# Patient Record
Sex: Male | Born: 1960 | Race: Black or African American | Hispanic: No | Marital: Single | Smoking: Never smoker
Health system: Southern US, Community
[De-identification: ages and names within clinical notes are randomized; demographics above are authoritative.]

## PROBLEM LIST (undated history)

## (undated) HISTORY — PX: TONSILLECTOMY: SUR1361

---

## 2004-02-14 ENCOUNTER — Emergency Department (HOSPITAL_COMMUNITY): Admission: EM | Admit: 2004-02-14 | Discharge: 2004-02-15 | Payer: Self-pay | Admitting: Emergency Medicine

## 2006-03-02 ENCOUNTER — Emergency Department (HOSPITAL_COMMUNITY): Admission: EM | Admit: 2006-03-02 | Discharge: 2006-03-02 | Payer: Self-pay | Admitting: Emergency Medicine

## 2006-04-15 ENCOUNTER — Emergency Department (HOSPITAL_COMMUNITY): Admission: EM | Admit: 2006-04-15 | Discharge: 2006-04-16 | Payer: Self-pay | Admitting: Emergency Medicine

## 2008-03-01 ENCOUNTER — Emergency Department (HOSPITAL_COMMUNITY): Admission: EM | Admit: 2008-03-01 | Discharge: 2008-03-01 | Payer: Self-pay | Admitting: Emergency Medicine

## 2010-12-03 ENCOUNTER — Emergency Department (HOSPITAL_BASED_OUTPATIENT_CLINIC_OR_DEPARTMENT_OTHER)
Admission: EM | Admit: 2010-12-03 | Discharge: 2010-12-03 | Disposition: A | Discharge status: Other Acute Inpatient Hospital | Payer: Self-pay | Attending: Emergency Medicine | Admitting: Emergency Medicine

## 2010-12-03 ENCOUNTER — Encounter: Payer: Self-pay | Admitting: Student

## 2010-12-03 ENCOUNTER — Other Ambulatory Visit: Payer: Self-pay

## 2010-12-03 ENCOUNTER — Emergency Department (INDEPENDENT_AMBULATORY_CARE_PROVIDER_SITE_OTHER): Payer: Self-pay

## 2010-12-03 ENCOUNTER — Observation Stay (HOSPITAL_COMMUNITY)
Admission: EM | Admit: 2010-12-03 | Discharge: 2010-12-04 | Disposition: A | Discharge status: Home / Self Care | Payer: Self-pay | Attending: Emergency Medicine | Admitting: Emergency Medicine

## 2010-12-03 DIAGNOSIS — M79609 Pain in unspecified limb: Secondary | ICD-10-CM | POA: Insufficient documentation

## 2010-12-03 DIAGNOSIS — R079 Chest pain, unspecified: Secondary | ICD-10-CM

## 2010-12-03 DIAGNOSIS — R0789 Other chest pain: Principal | ICD-10-CM | POA: Insufficient documentation

## 2010-12-03 LAB — CK TOTAL AND CKMB (NOT AT ARMC)
Relative Index: 1 (ref 0.0–2.5)
Total CK: 483 U/L — ABNORMAL HIGH (ref 7–232)
Total CK: 432 U/L — ABNORMAL HIGH (ref 7–232)

## 2010-12-03 LAB — COMPREHENSIVE METABOLIC PANEL
ALT: 34 U/L (ref 0–53)
AST: 37 U/L (ref 0–37)
Alkaline Phosphatase: 62 U/L (ref 39–117)
CO2: 27 mEq/L (ref 19–32)
Calcium: 9.3 mg/dL (ref 8.4–10.5)
GFR calc non Af Amer: 59 mL/min — ABNORMAL LOW (ref >60)
Glucose, Bld: 106 mg/dL — ABNORMAL HIGH (ref 70–99)
Potassium: 3.9 mEq/L (ref 3.5–5.1)
Sodium: 142 mEq/L (ref 135–145)

## 2010-12-03 LAB — CBC
HCT: 40.9 % (ref 39.0–52.0)
MCH: 30.7 pg (ref 26.0–34.0)
MCV: 85.4 fL (ref 78.0–100.0)
RBC: 4.79 MIL/uL (ref 4.22–5.81)
RDW: 13.3 % (ref 11.5–15.5)

## 2010-12-03 LAB — TROPONIN I
Troponin I: <0.3 ng/mL (ref <0.30)
Troponin I: <0.3 ng/mL (ref <0.30)

## 2010-12-03 MED ORDER — ASPIRIN 325 MG PO TABS
325.0000 mg | ORAL_TABLET | Freq: Once | ORAL | Status: AC
  Administered 2010-12-03: 325 mg via ORAL
  Filled 2010-12-03: qty 1

## 2010-12-03 NOTE — ED Notes (Signed)
MD at bedside. 
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
## 2010-12-03 NOTE — ED Notes (Signed)
See triage note.

## 2010-12-03 NOTE — ED Notes (Signed)
[facility] transported pt to [facility]

## 2010-12-04 DIAGNOSIS — R072 Precordial pain: Secondary | ICD-10-CM

## 2012-11-26 ENCOUNTER — Ambulatory Visit
Admission: RE | Admit: 2012-11-26 | Discharge: 2012-11-26 | Disposition: A | Discharge status: Home / Self Care | Payer: No Typology Code available for payment source | Source: Ambulatory Visit | Attending: Infectious Diseases | Admitting: Infectious Diseases

## 2012-11-26 ENCOUNTER — Other Ambulatory Visit: Payer: Self-pay | Admitting: Infectious Diseases

## 2012-11-26 DIAGNOSIS — R7611 Nonspecific reaction to tuberculin skin test without active tuberculosis: Secondary | ICD-10-CM

## 2013-08-18 ENCOUNTER — Ambulatory Visit (INDEPENDENT_AMBULATORY_CARE_PROVIDER_SITE_OTHER): Payer: No Typology Code available for payment source | Admitting: Family Medicine

## 2013-08-18 ENCOUNTER — Ambulatory Visit: Payer: No Typology Code available for payment source

## 2013-08-18 VITALS — BP 142/92 | HR 77 | Temp 98.0°F | Resp 18 | Ht 74.5 in | Wt 339.0 lb

## 2013-08-18 DIAGNOSIS — R091 Pleurisy: Secondary | ICD-10-CM

## 2013-08-18 DIAGNOSIS — R0789 Other chest pain: Secondary | ICD-10-CM

## 2013-08-18 DIAGNOSIS — R071 Chest pain on breathing: Secondary | ICD-10-CM

## 2013-08-18 DIAGNOSIS — S29011A Strain of muscle and tendon of front wall of thorax, initial encounter: Secondary | ICD-10-CM

## 2013-08-18 DIAGNOSIS — I241 Dressler's syndrome: Secondary | ICD-10-CM

## 2013-08-18 DIAGNOSIS — S2341XA Sprain of ribs, initial encounter: Secondary | ICD-10-CM

## 2013-08-18 LAB — POCT SEDIMENTATION RATE: POCT SED RATE: 20 mm/h (ref 0–22)

## 2013-08-18 LAB — POCT CBC
GRANULOCYTE PERCENT: 58.9 % (ref 37–80)
HEMATOCRIT: 44.9 % (ref 43.5–53.7)
Hemoglobin: 14.9 g/dL (ref 14.1–18.1)
LYMPH, POC: 2.3 (ref 0.6–3.4)
MCH, POC: 30.8 pg (ref 27–31.2)
MCHC: 33.2 g/dL (ref 31.8–35.4)
MCV: 92.7 fL (ref 80–97)
MID (CBC): 0.5 (ref 0–0.9)
MPV: 8.3 fL (ref 0–99.8)
PLATELET COUNT, POC: 254 10*3/uL (ref 142–424)
POC GRANULOCYTE: 4 (ref 2–6.9)
POC LYMPH PERCENT: 33.1 %L (ref 10–50)
POC MID %: 8 % (ref 0–12)
RBC: 4.84 M/uL (ref 4.69–6.13)
RDW, POC: 13.2 %
WBC: 6.8 10*3/uL (ref 4.6–10.2)

## 2013-08-18 MED ORDER — HYDROCODONE-ACETAMINOPHEN 10-325 MG PO TABS: 1.0000 | ORAL_TABLET | ORAL | Status: AC | PRN

## 2013-08-18 MED ORDER — HYDROCODONE-ACETAMINOPHEN 10-325 MG PO TABS: 20.0000 | ORAL_TABLET | ORAL | Status: DC | PRN

## 2013-08-18 MED ORDER — DICLOFENAC SODIUM 75 MG PO TBEC: 75.0000 mg | DELAYED_RELEASE_TABLET | Freq: Two times a day (BID) | ORAL | Status: AC

## 2013-08-18 NOTE — Patient Instructions (Signed)
Take the diclofenac (Voltaren) one twice daily with food  Use the hydrocodone pain pills every 4 hours when needed for pain. May cause drowsiness and should probably mostly be used when trying to rest at night.  If not improving over the next 3-4 days please return  Avoid vigorous upper body exercise until improved

## 2013-08-18 NOTE — Progress Notes (Signed)
Subjective: 53 year old man with right chest wall pain since yesterday. He had been working out at [employer], though he thought he had a little discomfort right prior to working out. He hurts severely in the right lateral chest wall, in the axillary line about halfway down. No clear-cut trauma. He has not had this before. He is not on regular medications. 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. [place] use to do [place] so he has anatomic knowledge of these things.  Objective: Pleasant alert large man in no major distress except when he tries to breathe deep he splints on the right. He has chest is clear to auscultation. Heart regular without murmurs. He has tenderness of the right lateral chest wall. No wheezing or coughing.  Assessment: Right chest wall pain and pleurisy  Plan: Chest x-ray CBC and sedimentation rate  [facility] reading (PRIMARY) by  Dr. Alwyn RenHopper Normal cxr and ribs  Results for orders placed in visit on 08/18/13  POCT CBC      Result Value Ref Range   WBC 6.8  4.6 - 10.2 K/uL   Lymph, poc 2.3  0.6 - 3.4   POC LYMPH PERCENT 33.1  10 - 50 %L   MID (cbc) 0.5  0 - 0.9   POC MID % 8.0  0 - 12 %M   POC Granulocyte 4.0  2 - 6.9   Granulocyte percent 58.9  37 - 80 %G   RBC 4.84  4.69 - 6.13 M/uL   Hemoglobin 14.9  14.1 - 18.1 g/dL   HCT, POC 16.144.9  09.643.5 - 53.7 %   MCV 92.7  80 - 97 fL   MCH, POC 30.8  27 - 31.2 pg   MCHC 33.2  31.8 - 35.4 g/dL   RDW, POC 04.513.2     Platelet Count, POC 254  142 - 424 K/uL   MPV 8.3  0 - 99.8 fL   View the instructions. Return if worse. Anti-inflammatory and pain medication.

## 2013-08-23 ENCOUNTER — Telehealth: Payer: Self-pay

## 2013-08-23 NOTE — Telephone Encounter (Signed)
Patient would like to speak with Dr Alwyn RenHopper regarding his visit on Friday.     Best#:  [phone]

## 2013-08-23 NOTE — Telephone Encounter (Signed)
Spoke with pt. He was having night sweats, but he thinks it was due to the Hydrocodone. Is going to try not taking it tonight and see how he does and will call us back if he still has trouble.

## 2014-08-30 IMAGING — CR DG CHEST 1V
1 series · 1 of 1 positions shown · non-contrast
Comparison: Chest x-ray of 12/03/2010

CLINICAL DATA: Positive PPD

CHEST - 1 VIEW

[view not recorded]
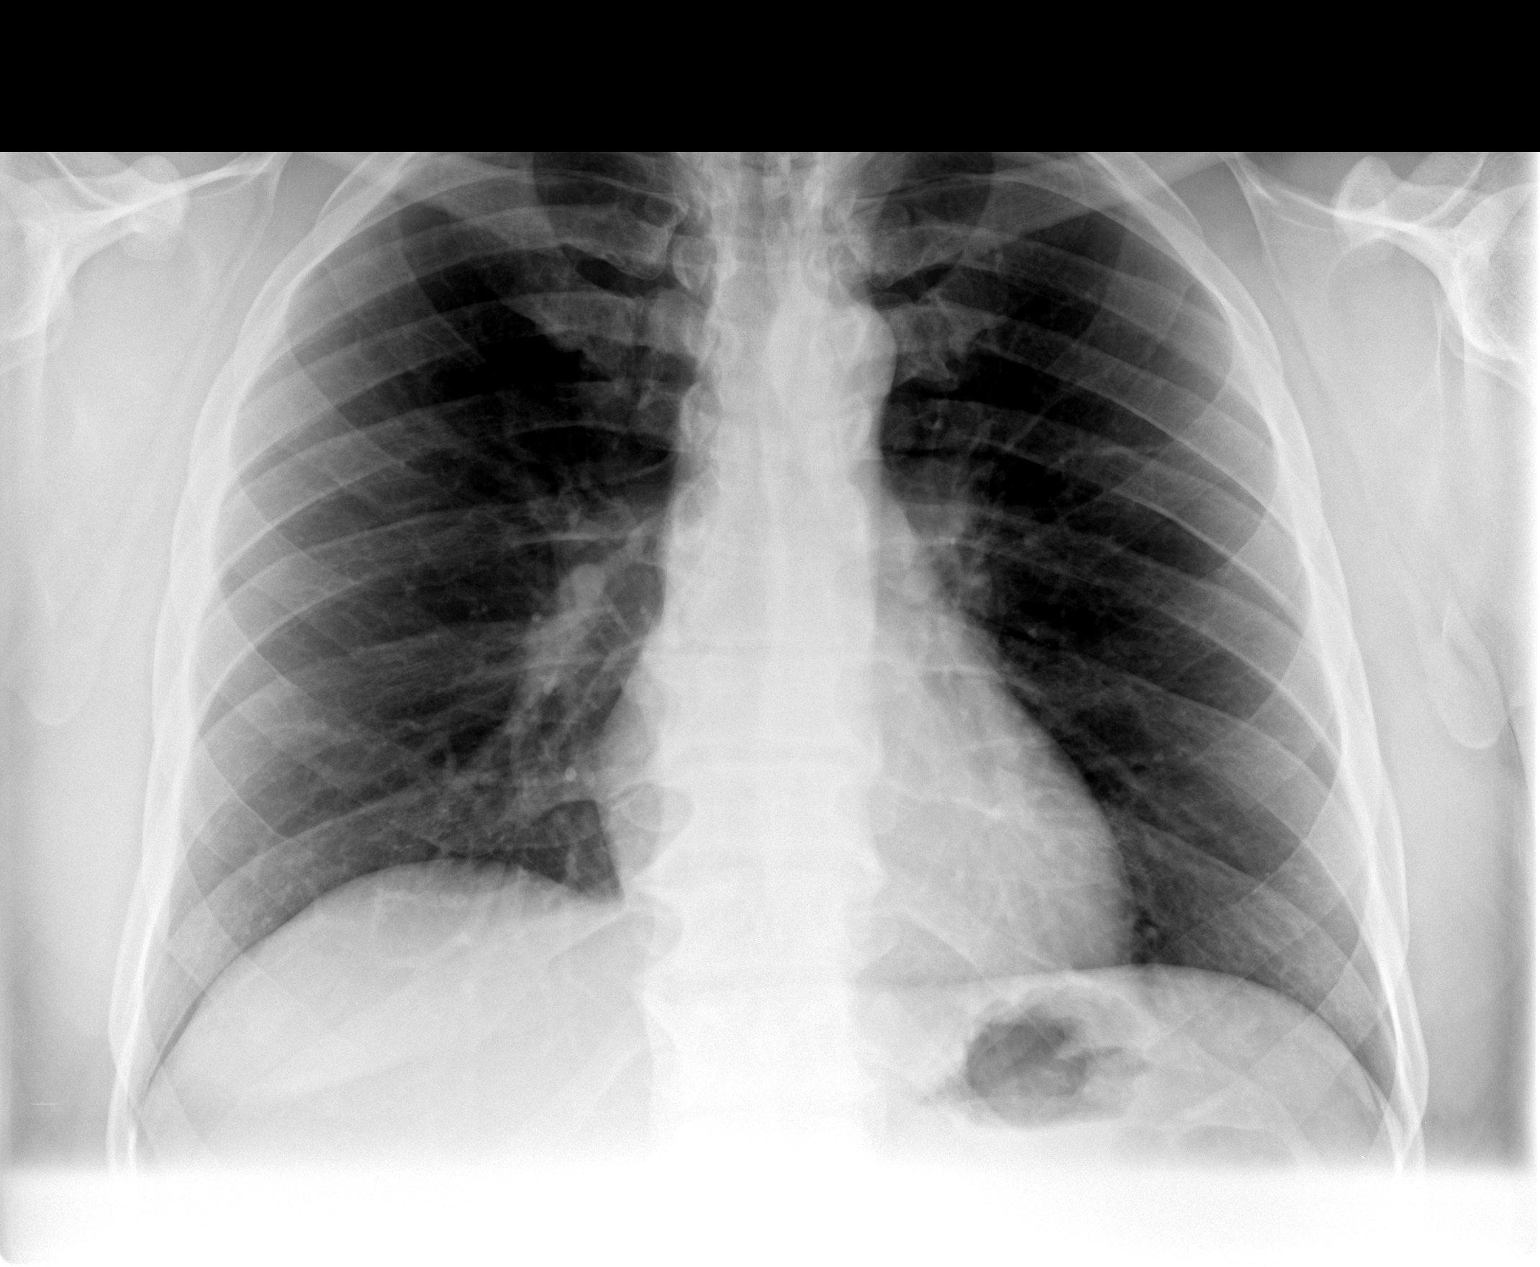

[1 of 1 positions shown; findings below may reference images not displayed]

FINDINGS: No active infiltrate or effusion is seen.  Mediastinal
contours appear normal.  The heart is within upper limits of
normal.  There are degenerative changes throughout the thoracic
spine.
IMPRESSION: No active lung disease.

## 2015-05-22 IMAGING — CR DG CHEST 1V
1 series · 1 of 1 positions shown · non-contrast
Comparison: DG RIBS W/ CHEST 3+V*R* dated 08/18/2013; DG CHEST 1
VIEW dated 11/26/2012; DG CHEST 2 VIEW dated 12/03/2010

CLINICAL DATA: Right-sided chest wall pain, pleurisy

EXAM:
CHEST - 1 VIEW

[lateral]
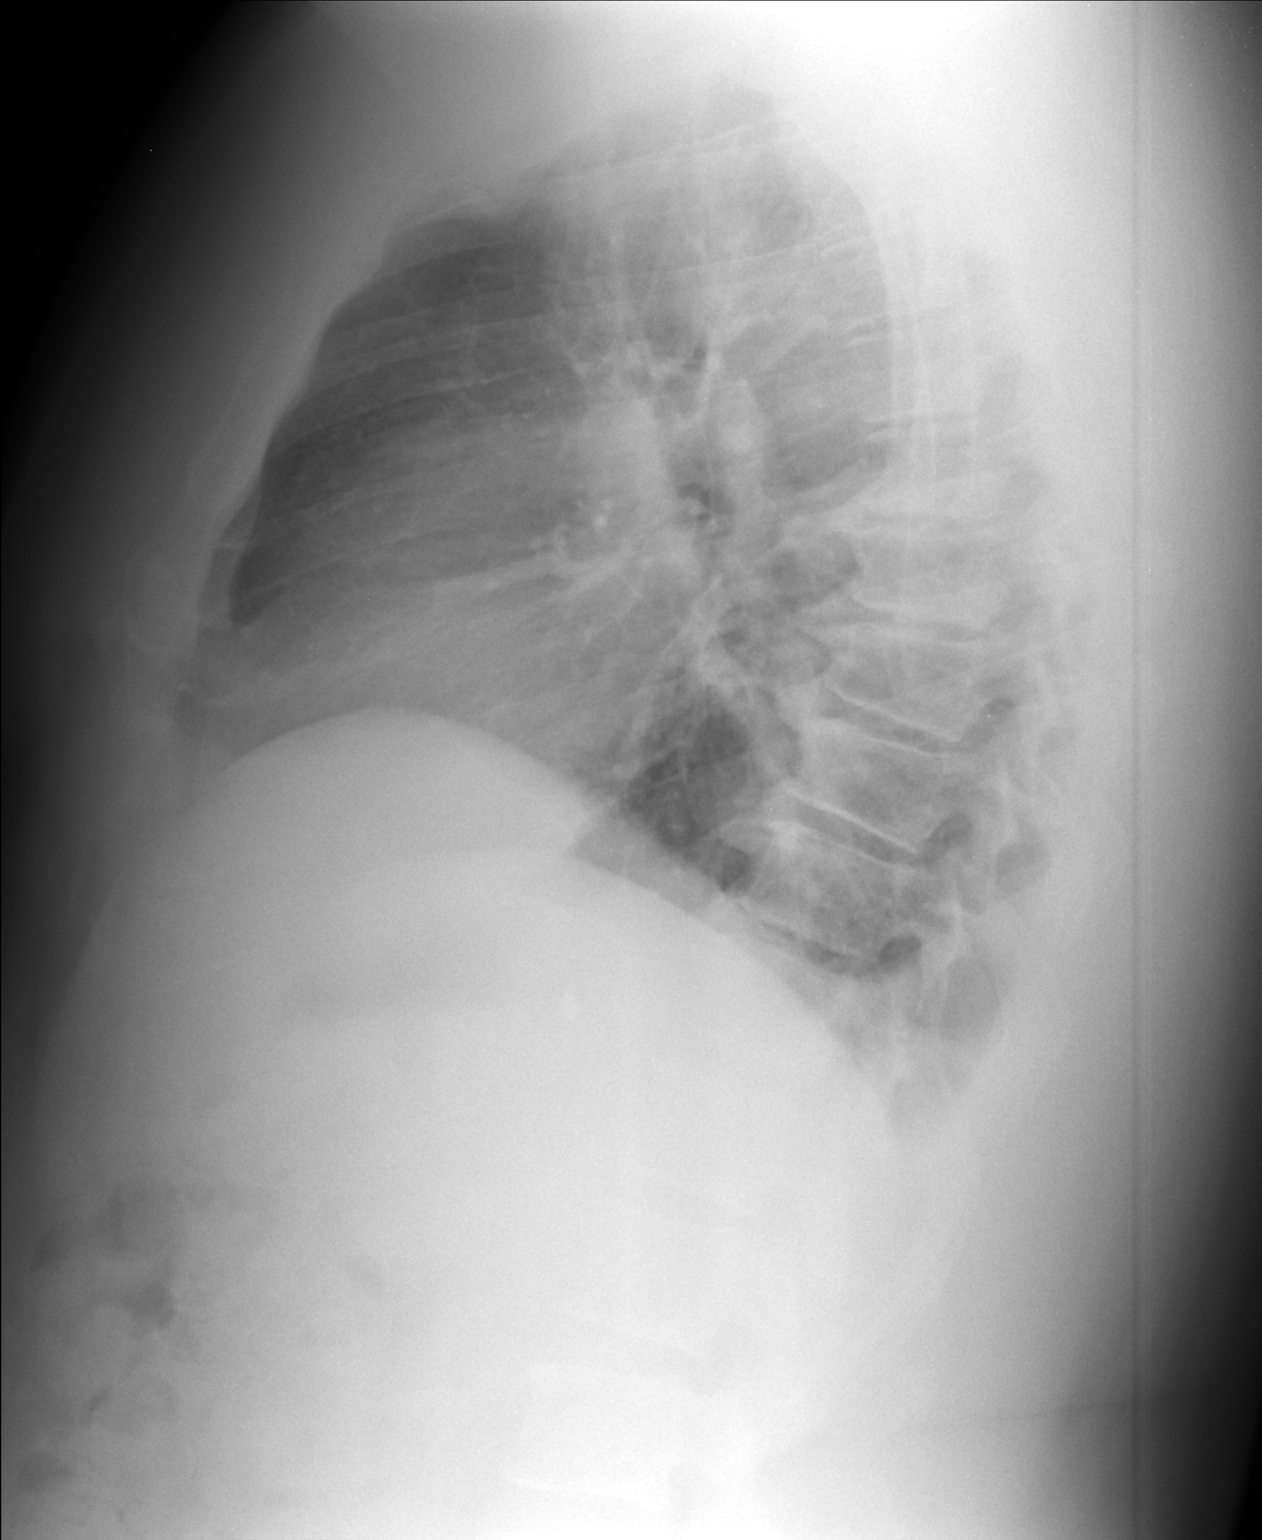

[1 of 1 positions shown; findings below may reference images not displayed]

FINDINGS: This examination is interpreted in conjunction with right rib
radiographic series performed earlier same day.

Grossly unchanged cardiac silhouette and mediastinal contours with
slight differences attributable to decreased lung volumes. No focal
airspace opacities. No pleural effusion or pneumothorax. No evidence
of edema.

No acute osseus abnormalities. Specifically, no definite displaced
rib fractures.
IMPRESSION: Decreased lung volumes without acute cardiopulmonary disease.
Specifically, no definite displaced right-sided rib fractures.
